# Patient Record
Sex: Female | Born: 1995 | Race: White | Hispanic: No | Marital: Single | Smoking: Never smoker
Health system: Southern US, Community
[De-identification: ages and names within clinical notes are randomized; demographics above are authoritative.]

## PROBLEM LIST (undated history)

## (undated) DIAGNOSIS — R002 Palpitations: Secondary | ICD-10-CM

## (undated) DIAGNOSIS — K219 Gastro-esophageal reflux disease without esophagitis: Secondary | ICD-10-CM

## (undated) DIAGNOSIS — I491 Atrial premature depolarization: Secondary | ICD-10-CM

## (undated) DIAGNOSIS — J45909 Unspecified asthma, uncomplicated: Secondary | ICD-10-CM

## (undated) HISTORY — DX: Palpitations: R00.2

## (undated) HISTORY — DX: Atrial premature depolarization: I49.1

## (undated) HISTORY — DX: Unspecified asthma, uncomplicated: J45.909

---
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
## 2016-02-15 ENCOUNTER — Telehealth: Payer: Self-pay | Admitting: *Deleted

## 2016-02-15 NOTE — Telephone Encounter (Signed)
Pt mom would like a return call regarding her bill. She states she has several concerns and would like to speak with you rather than someone else relay messages.

## 2016-02-16 NOTE — Telephone Encounter (Signed)
OK TO ADD TO [facility] A1 ACCT PER AMY

## 2016-02-16 NOTE — Telephone Encounter (Signed)
NOTE ABOUT DISCOUNT  BELOW IN ERROR - WRONG PT - SENT NOTE TO AMY J ABOUT ADDING Sharlize'S BAL TO [facility] A1 ACCT

## 2016-02-16 NOTE — Telephone Encounter (Signed)
OK TO GIVE DISCOUNT PER DR B °

## 2016-03-30 ENCOUNTER — Telehealth: Payer: Self-pay | Admitting: Allergy and Immunology

## 2016-03-30 NOTE — Telephone Encounter (Signed)
Please call pt mother back when you return regarding bill received. Thank you.

## 2016-04-04 NOTE — Telephone Encounter (Signed)
Asked Amy to send Amariz's bal over the [employer] or tell me who to call to have this done

## 2016-08-23 ENCOUNTER — Telehealth: Payer: Self-pay | Admitting: *Deleted

## 2016-08-23 NOTE — Telephone Encounter (Signed)
NOTES SENT TO SCHEDULING.  °

## 2016-08-30 ENCOUNTER — Telehealth: Payer: Self-pay | Admitting: Cardiovascular Disease

## 2016-08-30 NOTE — Telephone Encounter (Signed)
[facility] appointment on 09/12/16 with Dr Duke Salviaandolph.  Records put with Dr Leonides Sakeandolph's schedule for 09/12/16. lp

## 2016-09-12 ENCOUNTER — Ambulatory Visit (INDEPENDENT_AMBULATORY_CARE_PROVIDER_SITE_OTHER): Payer: BLUE CROSS/BLUE SHIELD | Admitting: Cardiovascular Disease

## 2016-09-12 ENCOUNTER — Encounter: Payer: Self-pay | Admitting: *Deleted

## 2016-09-12 ENCOUNTER — Encounter: Payer: Self-pay | Admitting: Cardiovascular Disease

## 2016-09-12 VITALS — BP 122/83 | HR 62 | Ht 63.0 in | Wt 172.8 lb

## 2016-09-12 DIAGNOSIS — R002 Palpitations: Secondary | ICD-10-CM

## 2016-09-12 HISTORY — DX: Palpitations: R00.2

## 2016-09-12 NOTE — Progress Notes (Signed)
Cardiology Office Note   Date:  09/12/2016   ID:  Paula MattocksKatie A Azure, DOB Jun 06, 1996, MRN [id number]  PCP:  Jarrett SohoWharton, Courtney, PA-C  Cardiologist:   Chilton Siiffany Houghton, MD   Chief Complaint  Patient presents with  . New Patient (Initial Visit)  . Shortness of Breath    randomly.  . Chest Pain    randomly.  . Dizziness    occasionally.      History of Present Illness: Paula MattocksKatie A Zavala is a 21 y.o. female with asthma who presents for an evaluation of palpitations.  She saw Dr. Catha GosselinKevin Little on 08/22/16 and reported palpitations.  For the last 2.5 months she reports Episodes of palpitations almost daily. It typically lasts for 15-20 minutes each day. It occurs without warning and is not associated with any particular activities. It occurs both at rest and with exertion. She feels like her heart is pounding and is going to come out of her chest. She has difficulty breathing and there is associated lightheadedness. Her chest feels tight. Prior to seeing Dr. Clarene DukeLittle she had an episode that lasted almost constantly for 2 days. She typically drinks 2 caffeinated sodas daily. She reduced this to one has noted swelling in her symptoms. She saw Dr. Clarene DukeLittle she had an EKG that was unremarkable.  She had blood work that was all reportedly normal.    Paula Zavala doesn't get any formal exercise but stands on her feet all day. 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 Asthma   . Heart palpitations   . Palpitations 09/12/2016    No past surgical history on file.   Current Outpatient Prescriptions  Medication Sig Dispense Refill  . acetaminophen (TYLENOL) 500 MG tablet Take 1,000 mg by mouth every 6 (six) hours as needed. Patient was given this medication for her stomach cramps.    . budesonide-formoterol (SYMBICORT) 160-4.5 MCG/ACT inhaler Inhale 2 puffs into the lungs 2 (two) times daily. 1 Inhaler 5  . EPINEPHrine  (EPIPEN 2-PAK) 0.3 mg/0.3 mL IJ SOAJ injection Inject 0.3 mLs (0.3 mg total) into the muscle once. 2 Device 2  . fluticasone (FLONASE) 50 MCG/ACT nasal spray 1 SPRAY IN EACH NARE 1-2 TIMES PER DAY 16 g 5  . fluticasone furoate-vilanterol (BREO ELLIPTA) 200-25 MCG/INH AEPB Inhale 1 puff into the lungs daily. 60 each 2  . ibuprofen (ADVIL,MOTRIN) 200 MG tablet Take 200 mg by mouth every 6 (six) hours as needed. Patient was given this medication for her stomach cramps.    . Norethin Ace-Eth Estrad-FE (TAYTULLA) 1-20 MG-MCG(24) CAPS Take 1-20 mg by mouth daily.    . pantoprazole (PROTONIX) 20 MG tablet TAKE ONE TABLET 30 MINUTESS BEFORE A MEAL 90 tablet 5  . PROAIR HFA 108 (90 Base) MCG/ACT inhaler Inhale 2 puffs into the lungs every 4 (four) hours as needed.  1  . ranitidine (ZANTAC) 300 MG tablet Take 1 tablet (300 mg total) by mouth at bedtime. 30 tablet 5   No current facility-administered medications for this visit.     Allergies:   Sulfa antibiotics    Social History:  The patient  reports that she has never smoked. She has never used smokeless tobacco. She reports that she drinks alcohol. She reports that she does not use drugs.   Family History:  The patient's family history includes Asthma in her sister; COPD in her paternal grandfather; Congenital heart  disease in her father; Heart attack in her maternal grandfather; Hypertension in her father, mother, and paternal grandfather; Hyperthyroidism in her maternal grandmother and mother.    ROS:  Please see the history of present illness.   Otherwise, review of systems are positive for none.   All other systems are reviewed and negative.    PHYSICAL EXAM: VS:  BP 122/83   Pulse 62   Ht 5\' 3"  (1.6 m)   Wt 78.4 kg (172 lb 12.8 oz)   BMI 30.61 kg/m  , BMI Body mass index is 30.61 kg/m. GENERAL:  Well appearing HEENT:  Pupils equal round and reactive, fundi not visualized, oral mucosa unremarkable NECK:  No jugular venous distention,  waveform within normal limits, carotid upstroke brisk and symmetric, no bruits, no thyromegaly LYMPHATICS:  No cervical adenopathy LUNGS:  Clear to auscultation bilaterally HEART:  RRR.  PMI not displaced or sustained,S1 and S2 within normal limits, no S3, no S4, no clicks, no rubs, no murmurs ABD:  Flat, positive bowel sounds normal in frequency in pitch, no bruits, no rebound, no guarding, no midline pulsatile mass, no hepatomegaly, no splenomegaly EXT:  2 plus pulses throughout, no edema, no cyanosis no clubbing SKIN:  No rashes no nodules NEURO:  Cranial nerves II through XII grossly intact, motor grossly intact throughout PSYCH:  Cognitively intact, oriented to person place and time    EKG:  EKG is not ordered today. 08/22/16: Sinus rhythm.  Rate 66 bpm.  PR 102 ms   Recent Labs: No results found for requested labs within last 8760 hours.   03/09/15: WBC 8.5, hemoglobin 12.1, hematocrit 36.6, platelets 240  11/07/14:  Sodium 136, potassium 4.2, BUN 11,  Creatinine 0.59 AST 14, ALT 14  Lipid Panel No results found for: CHOL, TRIG, HDL, CHOLHDL, VLDL, LDLCALC, LDLDIRECT    Wt Readings from Last 3 Encounters:  09/12/16 78.4 kg (172 lb 12.8 oz) (92 %, Z= 1.44)*  09/07/15 68 kg (149 lb 14.6 oz) (82 %, Z= 0.92)*  11/06/11 50.3 kg (111 lb) (42 %, Z= -0.21)*   * Growth percentiles are based on CDC 2-20 Years data.      ASSESSMENT AND PLAN:  # Palpitations:  Paula Zavala reports sustained palpitations.  Her EKG shows a short PR interval but no evidence of a delta wave.  She had labs checked in 2016 that were normal and recently had labs with her PCP, including her thyroid level, that were all reportedly normal. Given that his symptoms occur almost daily we will have her wear 48 hour old term to better assess.     Current medicines are reviewed at length with the patient today.  The patient does not have concerns regarding medicines.  The following changes have been made:  no  change  Labs/ tests ordered today include:   Orders Placed This Encounter  Procedures  . Holter monitor - 48 hour     Disposition:   FU with Sanayah Munro C. Duke Salvia, MD, [facility] in 1 month.    This note was written with the assistance of speech recognition software.  Please excuse any transcriptional errors.  Signed, Babe Clenney C. Duke Salvia, MD, [facility]  09/12/2016 4:44 PM    [facility]

## 2016-09-12 NOTE — Patient Instructions (Signed)
Medication Instructions:  Your physician recommends that you continue on your current medications as directed. Please refer to the Current Medication list given to you today.  Labwork: none  Testing/Procedures: Your physician has recommended that you wear a holter monitor. Holter monitors are medical devices that record the heart's electrical activity. Doctors most often use these monitors to diagnose arrhythmias. Arrhythmias are problems with the speed or rhythm of the heartbeat. The monitor is a small, portable device. You can wear one while you do your normal daily activities. This is usually used to diagnose what is causing palpitations/syncope (passing out). 49 hour [facility] AT [address]  Follow-Up: Your physician recommends that you schedule a follow-up appointment in: 1 MONTH OV  If you need a refill on your cardiac medications before your next appointment, please call your pharmacy.

## 2016-09-20 ENCOUNTER — Ambulatory Visit (INDEPENDENT_AMBULATORY_CARE_PROVIDER_SITE_OTHER): Payer: BLUE CROSS/BLUE SHIELD

## 2016-09-20 DIAGNOSIS — R002 Palpitations: Secondary | ICD-10-CM | POA: Diagnosis not present

## 2016-10-25 ENCOUNTER — Encounter: Payer: Self-pay | Admitting: Cardiovascular Disease

## 2016-10-25 ENCOUNTER — Ambulatory Visit (INDEPENDENT_AMBULATORY_CARE_PROVIDER_SITE_OTHER): Payer: BLUE CROSS/BLUE SHIELD | Admitting: Cardiovascular Disease

## 2016-10-25 VITALS — BP 113/76 | HR 62 | Ht 63.0 in | Wt 175.4 lb

## 2016-10-25 DIAGNOSIS — R002 Palpitations: Secondary | ICD-10-CM | POA: Diagnosis not present

## 2016-10-25 DIAGNOSIS — I491 Atrial premature depolarization: Secondary | ICD-10-CM | POA: Insufficient documentation

## 2016-10-25 HISTORY — DX: Atrial premature depolarization: I49.1

## 2016-10-25 MED ORDER — METOPROLOL TARTRATE 25 MG PO TABS: ORAL_TABLET | ORAL | 1 refills | Status: DC

## 2016-10-25 NOTE — Progress Notes (Signed)
Cardiology Office Note   Date:  10/25/2016   ID:  Paula Zavala, DOB 10/13/1995, MRN [id number]  PCP:  Jarrett Soho, PA-C  Cardiologist:   Chilton Si, MD   Chief Complaint  Patient presents with  . Follow-up    1 month;      History of Present Illness: Paula Zavala is a 21 y.o. female with asthma and PACs who presents for follow up.  She was first seen 08/2016 for an evaluation of palpitations.  She saw Dr. Catha Gosselin on 08/22/16 and reported palpitations.  At the time she was experiencing daily palpitations. She was referred for a 48 hour Holter that revealed occasional PACs and sinus arrhythmia. Since then she continues to have palpitations, though less frequently. It typically occurs at rest and there is no associated lightheadedness or dizziness. She has not had any changes in her diet and feels more stressed now than she was in the past. She denies chest pain, shortness of breath, lower extremity edema, orthopnea, or PND.  [name] doesn't get any formal exercise but stands on her feet all day. 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 Asthma   . Heart palpitations   . PAC (premature atrial contraction) 10/25/2016  . Palpitations 09/12/2016    No past surgical history on file.   Current Outpatient Prescriptions  Medication Sig Dispense Refill  . acetaminophen (TYLENOL) 500 MG tablet Take 1,000 mg by mouth every 6 (six) hours as needed. Patient was given this medication for her stomach cramps.    . budesonide-formoterol (SYMBICORT) 160-4.5 MCG/ACT inhaler Inhale 2 puffs into the lungs 2 (two) times daily. 1 Inhaler 5  . fluticasone furoate-vilanterol (BREO ELLIPTA) 200-25 MCG/INH AEPB Inhale 1 puff into the lungs daily. 60 each 2  . ibuprofen (ADVIL,MOTRIN) 200 MG tablet Take 200 mg by mouth every 6 (six) hours as needed. Patient was given this medication for her stomach  cramps.    . Norethin Ace-Eth Estrad-FE (TAYTULLA) 1-20 MG-MCG(24) CAPS Take 1-20 mg by mouth daily.    . pantoprazole (PROTONIX) 20 MG tablet TAKE ONE TABLET 30 MINUTESS BEFORE A MEAL 90 tablet 5  . PROAIR HFA 108 (90 Base) MCG/ACT inhaler Inhale 2 puffs into the lungs every 4 (four) hours as needed.  1  . ranitidine (ZANTAC) 300 MG tablet Take 1 tablet (300 mg total) by mouth at bedtime. 30 tablet 5  . metoprolol tartrate (LOPRESSOR) 25 MG tablet 1/2 TABLET BY MOUTH EVERY 4 HOURS AS NEEDED 30 tablet 1   No current facility-administered medications for this visit.     Allergies:   Sulfa antibiotics    Social History:  The patient  reports that she has never smoked. She has never used smokeless tobacco. She reports that she drinks alcohol. She reports that she does not use drugs.   Family History:  The patient's family history includes Asthma in her sister; COPD in her paternal grandfather; Congenital heart disease in her father; Heart attack in her maternal grandfather; Hypertension in her father, mother, and paternal grandfather; Hyperthyroidism in her maternal grandmother and mother.    ROS:  Please see the history of present illness.   Otherwise, review of systems are positive for none.   All other systems are reviewed and negative.    PHYSICAL EXAM: VS:  BP 113/76   Pulse 62   Ht 5\' 3"  (1.6  m)   Wt 79.6 kg (175 lb 6.4 oz)   BMI 31.07 kg/m  , BMI Body mass index is 31.07 kg/m. GENERAL:  Well appearing HEENT:  Pupils equal round and reactive, fundi not visualized, oral mucosa unremarkable NECK:  No jugular venous distention, waveform within normal limits, carotid upstroke brisk and symmetric, no bruits, no thyromegaly LYMPHATICS:  No cervical adenopathy LUNGS:  Clear to auscultation bilaterally HEART:  RRR.  PMI not displaced or sustained,S1 and S2 within normal limits, no S3, no S4, no clicks, no rubs, no murmurs ABD:  Flat, positive bowel sounds normal in frequency in pitch,  no bruits, no rebound, no guarding, no midline pulsatile mass, no hepatomegaly, no splenomegaly EXT:  2 plus pulses throughout, no edema, no cyanosis no clubbing SKIN:  No rashes no nodules NEURO:  Cranial nerves II through XII grossly intact, motor grossly intact throughout PSYCH:  Cognitively intact, oriented to person place and time    EKG:  EKG is not ordered today. 08/22/16: Sinus rhythm.  Rate 66 bpm.  PR 102 ms  48 Hour Holter Monitor 09/20/16:  Quality: Fair.  Baseline artifact. Predominant rhythm: sinus rhythm Average heart rate: 80 bpm Max heart rate: 146 bpm Min heart rate: 52 bpm PACs  No arrhythmias noted.  Recent Labs: No results found for requested labs within last 8760 hours.   03/09/15: WBC 8.5, hemoglobin 12.1, hematocrit 36.6, platelets 240  11/07/14:  Sodium 136, potassium 4.2, BUN 11,  Creatinine 0.59 AST 14, ALT 14  Lipid Panel No results found for: CHOL, TRIG, HDL, CHOLHDL, VLDL, LDLCALC, LDLDIRECT    Wt Readings from Last 3 Encounters:  10/25/16 79.6 kg (175 lb 6.4 oz)  09/12/16 78.4 kg (172 lb 12.8 oz) (92 %, Z= 1.44)*  09/07/15 68 kg (149 lb 14.6 oz) (82 %, Z= 0.92)*   * Growth percentiles are based on CDC 2-20 Years data.      ASSESSMENT AND PLAN:  # PACs:  [name] Continues to have palpitations, though less frequently than in the past.  Given her low blood pressure we will not start daily medication.  We will provide her with a prescription for metoprolol tartrate 12.5 mg to be taken as needed for sustained palpitations.   Current medicines are reviewed at length with the patient today.  The patient does not have concerns regarding medicines.  The following changes have been made:  Metoprolol 12.5 mg as needed  Labs/ tests ordered today include:   No orders of the defined types were placed in this encounter.    Disposition:   FU with Morna Flud C. Duke Salviaandolph, MD, [facility] in 6 months.    This note was written with the assistance of  speech recognition software.  Please excuse any transcriptional errors.  Signed, Ciearra Rufo C. Duke Salviaandolph, MD, [facility]  10/25/2016 4:38 PM     [facility]

## 2016-10-25 NOTE — Patient Instructions (Addendum)
Medication Instructions:  START METOPROLOL TART 12.5 MG ONE EVERY 4 HOURS AS NEEDED FOR PALPITATIONS   Labwork: NONE  Testing/Procedures: NONE  Follow-Up: Your physician wants you to follow-up in: 6 MONTH OV You will receive a reminder letter in the mail two months in advance. If you don't receive a letter, please call our office to schedule the follow-up appointment.  If you need a refill on your cardiac medications before your next appointment, please call your pharmacy.

## 2018-05-13 LAB — OB RESULTS CONSOLE HEPATITIS B SURFACE ANTIGEN: Hepatitis B Surface Ag: NEGATIVE

## 2018-05-13 LAB — OB RESULTS CONSOLE HIV ANTIBODY (ROUTINE TESTING): HIV: NONREACTIVE

## 2018-05-13 LAB — OB RESULTS CONSOLE RUBELLA ANTIBODY, IGM: Rubella: IMMUNE

## 2018-05-13 LAB — OB RESULTS CONSOLE RPR: RPR: NONREACTIVE

## 2018-05-16 LAB — OB RESULTS CONSOLE HIV ANTIBODY (ROUTINE TESTING): HIV: NONREACTIVE

## 2018-06-18 NOTE — L&D Delivery Note (Addendum)
Delivery Note At 12:22 PM a viable and healthy female was delivered via Vaginal, Spontaneous (Presentation: LOA ).  APGAR: 8, 9; weight  pending.   Placenta status: spontaneous, intact.  Cord:  withthe following complications: none .  Cord pH: na 300cc dark blood acutely after delivery c/w abruption  Anesthesia:  epidural Episiotomy: None Lacerations: 2nd degree Suture Repair: 2.0 vicryl rapide Est. Blood Loss (mL): 200  Mom to postpartum.  Baby to Couplet care / Skin to Skin.  Rpt labs now. Occ severe range BPs with pushing. Will consider MgSO4 pending labs and BP PP.  Verdia Bolt J 11/25/2018, 12:40 PM

## 2018-07-07 ENCOUNTER — Other Ambulatory Visit: Payer: Self-pay | Admitting: Obstetrics and Gynecology

## 2018-11-23 ENCOUNTER — Inpatient Hospital Stay (HOSPITAL_COMMUNITY): Payer: BLUE CROSS/BLUE SHIELD

## 2018-11-24 ENCOUNTER — Encounter (HOSPITAL_COMMUNITY): Payer: Self-pay

## 2018-11-24 ENCOUNTER — Other Ambulatory Visit: Payer: Self-pay | Admitting: Obstetrics and Gynecology

## 2018-11-24 ENCOUNTER — Other Ambulatory Visit: Payer: Self-pay

## 2018-11-24 ENCOUNTER — Inpatient Hospital Stay (HOSPITAL_COMMUNITY)
Admission: AD | Admit: 2018-11-24 | Discharge: 2018-11-27 | DRG: 807 | Disposition: A | Discharge status: Home / Self Care | Payer: BLUE CROSS/BLUE SHIELD | Attending: Obstetrics and Gynecology | Admitting: Obstetrics and Gynecology

## 2018-11-24 DIAGNOSIS — Z3A37 37 weeks gestation of pregnancy: Secondary | ICD-10-CM

## 2018-11-24 DIAGNOSIS — O134 Gestational [pregnancy-induced] hypertension without significant proteinuria, complicating childbirth: Principal | ICD-10-CM | POA: Diagnosis present

## 2018-11-24 DIAGNOSIS — O99214 Obesity complicating childbirth: Secondary | ICD-10-CM | POA: Diagnosis present

## 2018-11-24 DIAGNOSIS — Z1159 Encounter for screening for other viral diseases: Secondary | ICD-10-CM | POA: Diagnosis not present

## 2018-11-24 DIAGNOSIS — Z3490 Encounter for supervision of normal pregnancy, unspecified, unspecified trimester: Secondary | ICD-10-CM

## 2018-11-24 DIAGNOSIS — K219 Gastro-esophageal reflux disease without esophagitis: Secondary | ICD-10-CM

## 2018-11-24 DIAGNOSIS — O139 Gestational [pregnancy-induced] hypertension without significant proteinuria, unspecified trimester: Secondary | ICD-10-CM | POA: Diagnosis present

## 2018-11-24 LAB — CBC
HCT: 40.2 % (ref 36.0–46.0)
Hemoglobin: 13.5 g/dL (ref 12.0–15.0)
MCH: 28.5 pg (ref 26.0–34.0)
MCHC: 33.6 g/dL (ref 30.0–36.0)
MCV: 85 fL (ref 80.0–100.0)
Platelets: 165 10*3/uL (ref 150–400)
RBC: 4.73 MIL/uL (ref 3.87–5.11)
RDW: 14.2 % (ref 11.5–15.5)
WBC: 10.7 10*3/uL — ABNORMAL HIGH (ref 4.0–10.5)
nRBC: 0.4 % — ABNORMAL HIGH (ref 0.0–0.2)

## 2018-11-24 LAB — COMPREHENSIVE METABOLIC PANEL
ALT: 14 U/L (ref 0–44)
AST: 22 U/L (ref 15–41)
Albumin: 2.8 g/dL — ABNORMAL LOW (ref 3.5–5.0)
Alkaline Phosphatase: 189 U/L — ABNORMAL HIGH (ref 38–126)
Anion gap: 12 (ref 5–15)
BUN: 8 mg/dL (ref 6–20)
CO2: 21 mmol/L — ABNORMAL LOW (ref 22–32)
Calcium: 9.5 mg/dL (ref 8.9–10.3)
Chloride: 105 mmol/L (ref 98–111)
Creatinine, Ser: 0.62 mg/dL (ref 0.44–1.00)
GFR calc Af Amer: >60 mL/min (ref >60)
GFR calc non Af Amer: >60 mL/min (ref >60)
Glucose, Bld: 97 mg/dL (ref 70–99)
Potassium: 3.8 mmol/L (ref 3.5–5.1)
Sodium: 138 mmol/L (ref 135–145)
Total Bilirubin: 0.3 mg/dL (ref 0.3–1.2)
Total Protein: 6.4 g/dL — ABNORMAL LOW (ref 6.5–8.1)

## 2018-11-24 LAB — TYPE AND SCREEN
ABO/RH(D): O POS
Antibody Screen: NEGATIVE

## 2018-11-24 MED ORDER — OXYCODONE-ACETAMINOPHEN 5-325 MG PO TABS: 2.0000 | ORAL_TABLET | ORAL | Status: DC | PRN

## 2018-11-24 MED ORDER — OXYCODONE-ACETAMINOPHEN 5-325 MG PO TABS: 1.0000 | ORAL_TABLET | ORAL | Status: DC | PRN

## 2018-11-24 MED ORDER — LIDOCAINE HCL (PF) 1 % IJ SOLN: 30.0000 mL | INTRAMUSCULAR | Status: DC | PRN

## 2018-11-24 MED ORDER — OXYTOCIN 40 UNITS IN NORMAL SALINE INFUSION - SIMPLE MED: 2.5000 [IU]/h | INTRAVENOUS | Status: DC

## 2018-11-24 MED ORDER — LACTATED RINGERS IV SOLN: 500.0000 mL | INTRAVENOUS | Status: DC | PRN

## 2018-11-24 MED ORDER — OXYTOCIN BOLUS FROM INFUSION: 500.0000 mL | Freq: Once | INTRAVENOUS | Status: DC

## 2018-11-24 MED ORDER — TERBUTALINE SULFATE 1 MG/ML IJ SOLN: 0.2500 mg | Freq: Once | INTRAMUSCULAR | Status: DC | PRN

## 2018-11-24 MED ORDER — OXYTOCIN 40 UNITS IN NORMAL SALINE INFUSION - SIMPLE MED
1.0000 m[IU]/min | INTRAVENOUS | Status: DC
  Administered 2018-11-25: 2 m[IU]/min via INTRAVENOUS
  Filled 2018-11-24: qty 1000

## 2018-11-24 MED ORDER — MISOPROSTOL 25 MCG QUARTER TABLET
25.0000 ug | ORAL_TABLET | ORAL | Status: DC | PRN
  Administered 2018-11-24 – 2018-11-25 (×2): 25 ug via VAGINAL
  Filled 2018-11-24 (×2): qty 1

## 2018-11-24 MED ORDER — ACETAMINOPHEN 325 MG PO TABS: 650.0000 mg | ORAL_TABLET | ORAL | Status: DC | PRN

## 2018-11-24 MED ORDER — ONDANSETRON HCL 4 MG/2ML IJ SOLN: 4.0000 mg | Freq: Four times a day (QID) | INTRAMUSCULAR | Status: DC | PRN

## 2018-11-24 MED ORDER — LACTATED RINGERS IV SOLN
INTRAVENOUS | Status: DC
  Administered 2018-11-24 – 2018-11-25 (×2): via INTRAVENOUS

## 2018-11-24 MED ORDER — SOD CITRATE-CITRIC ACID 500-334 MG/5ML PO SOLN: 30.0000 mL | ORAL | Status: DC | PRN

## 2018-11-24 MED ORDER — FENTANYL CITRATE (PF) 100 MCG/2ML IJ SOLN
100.0000 ug | INTRAMUSCULAR | Status: DC | PRN
  Filled 2018-11-24 (×3): qty 2

## 2018-11-24 NOTE — H&P (Signed)
Paula Zavala is a 23 y.o. female presenting for IOL for gestational htn. OB History   No obstetric history on file.    Past Medical History:  Diagnosis Date  . Asthma   . Heart palpitations   . PAC (premature atrial contraction) 10/25/2016  . Palpitations 09/12/2016   No past surgical history on file. Family History: family history includes Asthma in her sister; COPD in her paternal grandfather; Congenital heart disease in her father; Heart attack in her maternal grandfather; Hypertension in her father, mother, and paternal grandfather; Hyperthyroidism in her maternal grandmother and mother. Social History:  reports that she has never smoked. She has never used smokeless tobacco. She reports current alcohol use. She reports that she does not use drugs.     Maternal Diabetes: No Genetic Screening: Normal Maternal Ultrasounds/Referrals: Normal Fetal Ultrasounds or other Referrals:  None Maternal Substance Abuse:  No Significant Maternal Medications:  None Significant Maternal Lab Results:  None Other Comments:  None  Review of Systems  Constitutional: Negative.   All other systems reviewed and are negative.  Maternal Medical History:  Reason for admission: Contractions.   Contractions: Onset was less than 1 hour ago.   Frequency: rare.   Perceived severity is mild.    Fetal activity: Perceived fetal activity is normal.   Last perceived fetal movement was within the past hour.    Prenatal complications: PIH.   Prenatal Complications - Diabetes: none.      There were no vitals taken for this visit. Maternal Exam:  Uterine Assessment: Contraction strength is mild.  Contraction frequency is rare.   Abdomen: Patient reports no abdominal tenderness. Fetal presentation: vertex  Introitus: Normal vulva. Normal vagina.  Ferning test: not done.  Nitrazine test: not done. Amniotic fluid character: not assessed.  Pelvis: adequate for delivery.   Cervix: Cervix evaluated by  digital exam.     Physical Exam  Nursing note and vitals reviewed. Constitutional: She is oriented to person, place, and time. She appears well-developed and well-nourished.  HENT:  Head: Normocephalic and atraumatic.  Neck: Normal range of motion. Neck supple.  Cardiovascular: Normal rate and regular rhythm.  Respiratory: Effort normal and breath sounds normal.  GI: Soft. Bowel sounds are normal.  Genitourinary:    Vulva, vagina and uterus normal.   Musculoskeletal: Normal range of motion.  Neurological: She is alert and oriented to person, place, and time. She has normal reflexes.  Skin: Skin is warm and dry.  Psychiatric: She has a normal mood and affect.    Prenatal labs: ABO, Rh:   Antibody:   Rubella:   RPR:    HBsAg:    HIV:    GBS:     Assessment/Plan: 37wk IUP Gestational HTN IOL   Karinna Beadles J 11/24/2018, 10:12 PM

## 2018-11-25 ENCOUNTER — Inpatient Hospital Stay (HOSPITAL_COMMUNITY): Payer: BLUE CROSS/BLUE SHIELD | Admitting: Anesthesiology

## 2018-11-25 ENCOUNTER — Encounter (HOSPITAL_COMMUNITY): Payer: Self-pay | Admitting: *Deleted

## 2018-11-25 DIAGNOSIS — O139 Gestational [pregnancy-induced] hypertension without significant proteinuria, unspecified trimester: Secondary | ICD-10-CM | POA: Diagnosis present

## 2018-11-25 LAB — CBC WITH DIFFERENTIAL/PLATELET
Abs Immature Granulocytes: 0.08 10*3/uL — ABNORMAL HIGH (ref 0.00–0.07)
Basophils Absolute: 0 10*3/uL (ref 0.0–0.1)
Basophils Relative: 0 %
Eosinophils Absolute: 0 10*3/uL (ref 0.0–0.5)
Eosinophils Relative: 0 %
HCT: 35.8 % — ABNORMAL LOW (ref 36.0–46.0)
Hemoglobin: 11.9 g/dL — ABNORMAL LOW (ref 12.0–15.0)
Immature Granulocytes: 1 %
Lymphocytes Relative: 6 %
Lymphs Abs: 1 10*3/uL (ref 0.7–4.0)
MCH: 28.6 pg (ref 26.0–34.0)
MCHC: 33.2 g/dL (ref 30.0–36.0)
MCV: 86.1 fL (ref 80.0–100.0)
Monocytes Absolute: 1 10*3/uL (ref 0.1–1.0)
Monocytes Relative: 6 %
Neutro Abs: 14 10*3/uL — ABNORMAL HIGH (ref 1.7–7.7)
Neutrophils Relative %: 87 %
Platelets: 155 10*3/uL (ref 150–400)
RBC: 4.16 MIL/uL (ref 3.87–5.11)
RDW: 14.3 % (ref 11.5–15.5)
WBC: 16.1 10*3/uL — ABNORMAL HIGH (ref 4.0–10.5)
nRBC: 0.1 % (ref 0.0–0.2)

## 2018-11-25 LAB — COMPREHENSIVE METABOLIC PANEL
ALT: 12 U/L (ref 0–44)
AST: 19 U/L (ref 15–41)
Albumin: 2.4 g/dL — ABNORMAL LOW (ref 3.5–5.0)
Alkaline Phosphatase: 156 U/L — ABNORMAL HIGH (ref 38–126)
Anion gap: 11 (ref 5–15)
BUN: 6 mg/dL (ref 6–20)
CO2: 19 mmol/L — ABNORMAL LOW (ref 22–32)
Calcium: 8.3 mg/dL — ABNORMAL LOW (ref 8.9–10.3)
Chloride: 106 mmol/L (ref 98–111)
Creatinine, Ser: 0.59 mg/dL (ref 0.44–1.00)
GFR calc Af Amer: >60 mL/min (ref >60)
GFR calc non Af Amer: >60 mL/min (ref >60)
Glucose, Bld: 83 mg/dL (ref 70–99)
Potassium: 3.7 mmol/L (ref 3.5–5.1)
Sodium: 136 mmol/L (ref 135–145)
Total Bilirubin: 0.4 mg/dL (ref 0.3–1.2)
Total Protein: 5.5 g/dL — ABNORMAL LOW (ref 6.5–8.1)

## 2018-11-25 LAB — RPR: RPR Ser Ql: NONREACTIVE

## 2018-11-25 LAB — SARS CORONAVIRUS 2: SARS Coronavirus 2: NOT DETECTED

## 2018-11-25 MED ORDER — DIBUCAINE (PERIANAL) 1 % EX OINT: 1.0000 "application " | TOPICAL_OINTMENT | CUTANEOUS | Status: DC | PRN

## 2018-11-25 MED ORDER — BUPIVACAINE HCL (PF) 0.25 % IJ SOLN
INTRAMUSCULAR | Status: DC | PRN
  Administered 2018-11-25 (×2): 5 mL via EPIDURAL

## 2018-11-25 MED ORDER — WITCH HAZEL-GLYCERIN EX PADS: 1.0000 "application " | MEDICATED_PAD | CUTANEOUS | Status: DC | PRN

## 2018-11-25 MED ORDER — BENZOCAINE-MENTHOL 20-0.5 % EX AERO
1.0000 "application " | INHALATION_SPRAY | CUTANEOUS | Status: DC | PRN
  Administered 2018-11-25: 1 via TOPICAL
  Filled 2018-11-25: qty 56

## 2018-11-25 MED ORDER — LABETALOL HCL 5 MG/ML IV SOLN: 20.0000 mg | INTRAVENOUS | Status: DC | PRN

## 2018-11-25 MED ORDER — DIPHENHYDRAMINE HCL 25 MG PO CAPS: 25.0000 mg | ORAL_CAPSULE | Freq: Four times a day (QID) | ORAL | Status: DC | PRN

## 2018-11-25 MED ORDER — TETANUS-DIPHTH-ACELL PERTUSSIS 5-2.5-18.5 LF-MCG/0.5 IM SUSP: 0.5000 mL | Freq: Once | INTRAMUSCULAR | Status: DC

## 2018-11-25 MED ORDER — COCONUT OIL OIL: 1.0000 "application " | TOPICAL_OIL | Status: DC | PRN

## 2018-11-25 MED ORDER — SENNOSIDES-DOCUSATE SODIUM 8.6-50 MG PO TABS
2.0000 | ORAL_TABLET | ORAL | Status: DC
  Administered 2018-11-25 – 2018-11-26 (×2): 2 via ORAL
  Filled 2018-11-25 (×2): qty 2

## 2018-11-25 MED ORDER — LABETALOL HCL 5 MG/ML IV SOLN: 80.0000 mg | INTRAVENOUS | Status: DC | PRN

## 2018-11-25 MED ORDER — ONDANSETRON HCL 4 MG PO TABS: 4.0000 mg | ORAL_TABLET | ORAL | Status: DC | PRN

## 2018-11-25 MED ORDER — SIMETHICONE 80 MG PO CHEW: 80.0000 mg | CHEWABLE_TABLET | ORAL | Status: DC | PRN

## 2018-11-25 MED ORDER — DIPHENHYDRAMINE HCL 50 MG/ML IJ SOLN: 12.5000 mg | INTRAMUSCULAR | Status: DC | PRN

## 2018-11-25 MED ORDER — HYDRALAZINE HCL 20 MG/ML IJ SOLN: 10.0000 mg | INTRAMUSCULAR | Status: DC | PRN

## 2018-11-25 MED ORDER — ZOLPIDEM TARTRATE 5 MG PO TABS: 5.0000 mg | ORAL_TABLET | Freq: Every evening | ORAL | Status: DC | PRN

## 2018-11-25 MED ORDER — EPHEDRINE 5 MG/ML INJ: 10.0000 mg | INTRAVENOUS | Status: DC | PRN

## 2018-11-25 MED ORDER — PRENATAL MULTIVITAMIN CH
1.0000 | ORAL_TABLET | Freq: Every day | ORAL | Status: DC
  Administered 2018-11-26: 1 via ORAL
  Filled 2018-11-25: qty 1

## 2018-11-25 MED ORDER — ONDANSETRON HCL 4 MG/2ML IJ SOLN: 4.0000 mg | INTRAMUSCULAR | Status: DC | PRN

## 2018-11-25 MED ORDER — OXYCODONE-ACETAMINOPHEN 5-325 MG PO TABS: 1.0000 | ORAL_TABLET | ORAL | Status: DC | PRN

## 2018-11-25 MED ORDER — OXYCODONE-ACETAMINOPHEN 5-325 MG PO TABS: 2.0000 | ORAL_TABLET | ORAL | Status: DC | PRN

## 2018-11-25 MED ORDER — PHENYLEPHRINE 40 MCG/ML (10ML) SYRINGE FOR IV PUSH (FOR BLOOD PRESSURE SUPPORT): 80.0000 ug | PREFILLED_SYRINGE | INTRAVENOUS | Status: DC | PRN

## 2018-11-25 MED ORDER — SODIUM CHLORIDE (PF) 0.9 % IJ SOLN
INTRAMUSCULAR | Status: DC | PRN
  Administered 2018-11-25: 12 mL/h via EPIDURAL

## 2018-11-25 MED ORDER — LIDOCAINE HCL (PF) 1 % IJ SOLN
INTRAMUSCULAR | Status: DC | PRN
  Administered 2018-11-25 (×4): 5 mL via EPIDURAL

## 2018-11-25 MED ORDER — FENTANYL-BUPIVACAINE-NACL 0.5-0.125-0.9 MG/250ML-% EP SOLN
12.0000 mL/h | EPIDURAL | Status: DC | PRN
  Filled 2018-11-25: qty 250

## 2018-11-25 MED ORDER — ACETAMINOPHEN 325 MG PO TABS
650.0000 mg | ORAL_TABLET | ORAL | Status: DC | PRN
  Administered 2018-11-25: 650 mg via ORAL
  Filled 2018-11-25: qty 2

## 2018-11-25 MED ORDER — IBUPROFEN 600 MG PO TABS
600.0000 mg | ORAL_TABLET | Freq: Four times a day (QID) | ORAL | Status: DC
  Administered 2018-11-25 – 2018-11-26 (×6): 600 mg via ORAL
  Filled 2018-11-25 (×7): qty 1

## 2018-11-25 MED ORDER — LACTATED RINGERS IV SOLN: 500.0000 mL | Freq: Once | INTRAVENOUS | Status: DC

## 2018-11-25 MED ORDER — LABETALOL HCL 5 MG/ML IV SOLN: 40.0000 mg | INTRAVENOUS | Status: DC | PRN

## 2018-11-25 NOTE — Anesthesia Preprocedure Evaluation (Signed)
Anesthesia Evaluation  Patient identified by MRN, date of birth, ID band Patient awake    Reviewed: Allergy & Precautions, NPO status , Patient's Chart, lab work & pertinent test results  Airway Mallampati: II       Dental no notable dental hx. (+) Dental Advisory Given   Pulmonary asthma ,    Pulmonary exam normal        Cardiovascular negative cardio ROS Normal cardiovascular exam     Neuro/Psych negative neurological ROS  negative psych ROS   GI/Hepatic Neg liver ROS, GERD  ,  Endo/Other  Morbid obesity  Renal/GU negative Renal ROS  negative genitourinary   Musculoskeletal negative musculoskeletal ROS (+)   Abdominal   Peds negative pediatric ROS (+)  Hematology negative hematology ROS (+)   Anesthesia Other Findings   Reproductive/Obstetrics (+) Pregnancy                             Anesthesia Physical Anesthesia Plan  ASA: III  Anesthesia Plan: Epidural   Post-op Pain Management:    Induction:   PONV Risk Score and Plan:   Airway Management Planned: Natural Airway  Additional Equipment:   Intra-op Plan:   Post-operative Plan:   Informed Consent: I have reviewed the patients History and Physical, chart, labs and discussed the procedure including the risks, benefits and alternatives for the proposed anesthesia with the patient or authorized representative who has indicated his/her understanding and acceptance.     Dental advisory given  Plan Discussed with: Anesthesiologist  Anesthesia Plan Comments:         Anesthesia Quick Evaluation

## 2018-11-25 NOTE — Progress Notes (Signed)
NICKCOLE BRALLEY is a 23 y.o. G1P0 at [redacted]w[redacted]d by LMP admitted for induction of labor due to Hypertension.  Subjective: crampy  Objective: BP (!) 142/79   Pulse (!) 50   Temp 98.9 F (37.2 C) (Oral)   Resp 16   Ht 5\' 2"  (1.575 m)   Wt 91.6 kg   BMI 36.95 kg/m  No intake/output data recorded. No intake/output data recorded.  FHT:  FHR: 145 bpm, variability: moderate,  accelerations:  Present,  decelerations:  Absent UC:   regular, every 1-4 minutes SVE:   Dilation: Fingertip Effacement (%): Thick Station: -2 Exam by:: [place] McHEnry RN  Labs: Lab Results  Component Value Date   WBC 10.7 (H) 11/24/2018   HGB 13.5 11/24/2018   HCT 40.2 11/24/2018   MCV 85.0 11/24/2018   PLT 165 11/24/2018    Assessment / Plan: IOL for Gestatonal HTN  Labor: Progressing normally Preeclampsia:  no signs or symptoms of toxicity Fetal Wellbeing:  Category I Pain Control:  Labor support without medications I/D:  n/a Anticipated MOD:  NSVD  Genelle Economou J 11/25/2018, 6:29 AM

## 2018-11-25 NOTE — Anesthesia Procedure Notes (Signed)
Epidural Patient location during procedure: OB Start time: 11/25/2018 8:22 AM End time: 11/25/2018 8:41 AM  Staffing Anesthesiologist: Duane Boston, MD Performed: anesthesiologist   Preanesthetic Checklist Completed: patient identified, site marked, pre-op evaluation, timeout performed, IV checked, risks and benefits discussed and monitors and equipment checked  Epidural Patient position: sitting Prep: DuraPrep Patient monitoring: heart rate, cardiac monitor, continuous pulse ox and blood pressure Approach: midline Location: L2-L3 Injection technique: LOR saline  Needle:  Needle type: Tuohy  Needle gauge: 17 G Needle length: 9 cm Needle insertion depth: 8 cm Catheter size: 20 Guage Catheter at skin depth: 12 cm Test dose: negative and Other  Assessment Events: blood not aspirated, injection not painful, no injection resistance and negative IV test  Additional Notes Informed consent obtained prior to proceeding including risk of failure, 1% risk of PDPH, risk of minor discomfort and bruising.  Discussed rare but serious complications including epidural abscess, permanent nerve injury, epidural hematoma.  Discussed alternatives to epidural analgesia and patient desires to proceed.  Timeout performed pre-procedure verifying patient name, procedure, and platelet count.  Patient tolerated procedure well.

## 2018-11-25 NOTE — Progress Notes (Signed)
I was asked by Dr. Ronita Hipps to assess patient and place IUPC.  IOL at 37+1 weeks for gestational hypertension.  S:  Feeling more comfortable after 2nd epidural placement. Still feeling some pressure on left side during contractions. No headaches, visual changes, or RUQ/epigastric pain.   O:  Pitocin at 12 milliunits  VS: Blood pressure (!) 159/105, pulse 88, temperature 98.9 F (37.2 C), temperature source Oral, resp. rate 16, height 5\' 2"  (1.575 m), weight 91.6 kg.         /09/20 1104  -  77  -  16  160/100Abnormal   -  -  -  - SJ   11/25/18 1101  -  70  -  16  156/105Abnormal   -  -  -  - SJ   11/25/18 0752  -  88  -  16  159/105Abnormal   -  -  -  - SJ   11/25/18 0745  -  84  -  16  147/103Abnormal   -  -  -  - SJ   11/25/18 0605  -  50Abnormal   -  -  142/79Abnormal   -  -  -  - GM   11/25/18 0520  -  53Abnormal   -  16  125/70  -  -  -  - GM   11/25/18 0306  -  60  -  16  140/92Abnormal   -  -  -  - GM   11/25/18 0305  98.9 F (37.2 C)  -  -  16  -  -  -  -  - GM     FHR : baseline 135 bpm / variability moderate / accelerations +15x15 / occasional variable decelerations        Toco: contractions every 1.5-3 minutes / moderate        Cervix : Dilation: 4.5 Effacement (%): 90 Cervical Position: Posterior Station: -1 Presentation: Vertex Exam by:: Taelyn Broecker        Membranes: clear IUPC placed without difficulty and pt. Tolerated well   A/P: IOL for gestational hypertension   - A few severe range BPs noted, but possibly due to pain response, monitor closely and treat as needed  - IV Labetalol PRN for severe range BPs   - No neural s/s     Active labor     FHR category 2     GBS Negative      Continue Pitocin augmentation to achieve adequate MVUs  Dr. Ronita Hipps updated with progress/plan     Lars Pinks, MSN, CNM [facility]

## 2018-11-25 NOTE — Progress Notes (Signed)
Called Dr. Ronita Hipps to report patient's blood pressure. Dr. Ronita Hipps stated he was aware of blood pressure, stated possibly pain related and gave orders for Labetelol prn administration per severe blood pressure range.

## 2018-11-25 NOTE — Lactation Note (Signed)
This note was copied from a baby's chart. Lactation Consultation Note  Patient Name: Paula Zavala [id number] Date: 11/25/2018 Reason for consult: Initial assessment;Primapara;1st time breastfeeding;Infant < 6lbs;Early term 37-38.6wks;Other (Comment)(SGA)  6 hours old ETI < 6 lbs female who is now being partially BF and formula fed by his mother, she's a P1. Mom experienced (+) breast changes during the pregnancy, she's already familiar with hand expression. When LC revised hand expression with mom noticed that her nipples were almost flat, but they're compressible. [place] set mom up with a DEBP and breast shells, instructions, cleaning and storage were reviewed as well as milk storage guidelines.  Baby doing STS with mom when entering the room, offered assistance with latch and mom agreed to latch baby on towards the end of [facility] consultation. Baby still jittery despite excellent blood sugars, parents have already give baby 7 ml of Similac 22 calorie formula, baby was very spitty during [facility] consultation and not ready to feed, even when mom attempted to latch towards the end of [facility] consultation baby was just not ready and fell asleep at the breast. Asked mom to call for assistance when needed.   Reviewed normal newborn behavior, feeding cues, cluster feeding, benefits of STS, risk of offering babies pacifiers (they had one in the room) and LPI policy with supplementation guidelines according to baby's age in hours.   Feeding plan:  1. Encourage mom to feed baby STS 8-12 times/24 hours or sooner if feeding cues are present 2. Mom will pump every 3 hours and will offer any amounts of EBM she may get to baby before offering any formula 3. Parents will continue supplementing with Similac 22 calorie formula every 3 hours or sooner if feeding cues are present 4. She'll start wearing her breast shells tomorrow, daytime only  BF brochure, BF resources, LPI handout and feeding diary were reviewed. Parents reported all  questions and concerns were answered, they're both aware of [facility] OP services and will call PRN.  Maternal Data Formula Feeding for Exclusion: No Has patient been taught Hand Expression?: Yes Does the patient have breastfeeding experience prior to this delivery?: No  Feeding Feeding Type: Breast Fed Nipple Type: Slow - flow  LATCH Score Latch: Too sleepy or reluctant, no latch achieved, no sucking elicited.  Audible Swallowing: None  Type of Nipple: Flat  Comfort (Breast/Nipple): Soft / non-tender  Hold (Positioning): Full assist, staff holds infant at breast  LATCH Score: 3  Interventions Interventions: Breast feeding basics reviewed;Assisted with latch;Skin to skin;Breast massage;Hand express;Breast compression;Reverse pressure;Adjust position;Support pillows;Shells;DEBP  Lactation Tools Discussed/Used Tools: Shells;Pump Shell Type: Inverted Breast pump type: Double-Electric Breast Pump WIC Program: No Pump Review: Setup, frequency, and cleaning;Milk Storage Initiated by:: MPeck Date initiated:: 11/25/18   Consult Status Consult Status: Follow-up Date: 11/26/18 Follow-up type: In-patient    Victorious Cosio Francene Boyers 11/25/2018, 6:34 PM

## 2018-11-25 NOTE — Progress Notes (Signed)
Called to room to assist Dr. Tobias Alexander. Dr. Tobias Alexander made the decision to replace the epidural and assisted with patient's positioning. Epidural replaced at 1037.

## 2018-11-25 NOTE — Anesthesia Procedure Notes (Signed)
Epidural Patient location during procedure: OB Start time: 11/25/2018 10:32 AM End time: 11/25/2018 10:44 AM  Staffing Anesthesiologist: Duane Boston, MD Performed: anesthesiologist   Preanesthetic Checklist Completed: patient identified, site marked, pre-op evaluation, timeout performed, IV checked, risks and benefits discussed and monitors and equipment checked  Epidural Patient position: sitting Prep: DuraPrep Patient monitoring: heart rate, cardiac monitor, continuous pulse ox and blood pressure Approach: midline Location: L3-L4 Injection technique: LOR saline  Needle:  Needle type: Tuohy  Needle gauge: 17 G Needle length: 9 cm Needle insertion depth: 7 cm Catheter size: 20 Guage Catheter at skin depth: 11 cm Test dose: negative and Other  Assessment Events: blood not aspirated, injection not painful, no injection resistance and negative IV test  Additional Notes Informed consent obtained prior to proceeding including risk of failure, 1% risk of PDPH, risk of minor discomfort and bruising.  Discussed rare but serious complications including epidural abscess, permanent nerve injury, epidural hematoma.  Discussed alternatives to epidural analgesia and patient desires to proceed.  Timeout performed pre-procedure verifying patient name, procedure, and platelet count.  Patient tolerated procedure well.

## 2018-11-25 NOTE — Progress Notes (Signed)
Paula Zavala is a 23 y.o. G1P0 at [redacted]w[redacted]d by LMP admitted for induction of labor due to Hypertension.  Subjective: No headaches or epigastric pain  Objective: BP (!) 159/105   Pulse 88   Temp 98.9 F (37.2 C) (Oral)   Resp 16   Ht 5\' 2"  (1.575 m)   Wt 91.6 kg   BMI 36.95 kg/m  No intake/output data recorded. No intake/output data recorded.  FHT:  FHR: 145 bpm, variability: moderate,  accelerations:  Present,  decelerations:  Absent UC:   irregular, every 1-4 minutes SVE:   Dilation: 2 Effacement (%): 70 Station: -2 Exam by:: Dr. Ronita Hipps  AROM- clear  Labs: Lab Results  Component Value Date   WBC 10.7 (H) 11/24/2018   HGB 13.5 11/24/2018   HCT 40.2 11/24/2018   MCV 85.0 11/24/2018   PLT 165 11/24/2018    Assessment / Plan: Induction of labor due to gestational hypertension,  progressing well on pitocin  Labor: Progressing normally Preeclampsia:  no signs or symptoms of toxicity Fetal Wellbeing:  Category I Pain Control:  Labor support without medications I/D:  n/a Anticipated MOD:  NSVD  Ophia Shamoon J 11/25/2018, 8:08 AM

## 2018-11-25 NOTE — Anesthesia Postprocedure Evaluation (Signed)
Anesthesia Post Note  Patient: Paula Zavala  Procedure(s) Performed: AN AD HOC LABOR EPIDURAL     Patient location during evaluation: Mother Baby Anesthesia Type: Epidural Level of consciousness: awake and alert Pain management: pain level controlled Vital Signs Assessment: post-procedure vital signs reviewed and stable Respiratory status: spontaneous breathing, nonlabored ventilation and respiratory function stable Cardiovascular status: stable Postop Assessment: no headache, no backache and epidural receding Anesthetic complications: no    Last Vitals:  Vitals:   11/25/18 1400 11/25/18 1445  BP: 122/72 134/90  Pulse: 84 66  Resp: 16 18  Temp:  37.3 C  SpO2:  98%    Last Pain:  Vitals:   11/25/18 1445  TempSrc: Oral  PainSc: 0-No pain   Pain Goal:                Epidural/Spinal Function Cutaneous sensation: Normal sensation (11/25/18 1445), Patient able to flex knees: Yes (11/25/18 1445), Patient able to lift hips off bed: Yes (11/25/18 1445), Back pain beyond tenderness at insertion site: No (11/25/18 1445), Progressively worsening motor and/or sensory loss: No (11/25/18 1445), Bowel and/or bladder incontinence post epidural: No (11/25/18 1445)  [name]

## 2018-11-25 NOTE — Progress Notes (Signed)
Dr. Tobias Alexander at bedside evaluating patient's pain relief response to epidural.

## 2018-11-26 LAB — CBC
HCT: 33.1 % — ABNORMAL LOW (ref 36.0–46.0)
Hemoglobin: 10.9 g/dL — ABNORMAL LOW (ref 12.0–15.0)
MCH: 28.5 pg (ref 26.0–34.0)
MCHC: 32.9 g/dL (ref 30.0–36.0)
MCV: 86.4 fL (ref 80.0–100.0)
Platelets: 135 10*3/uL — ABNORMAL LOW (ref 150–400)
RBC: 3.83 MIL/uL — ABNORMAL LOW (ref 3.87–5.11)
RDW: 14.5 % (ref 11.5–15.5)
WBC: 11.6 10*3/uL — ABNORMAL HIGH (ref 4.0–10.5)
nRBC: 0 % (ref 0.0–0.2)

## 2018-11-26 LAB — ABO/RH: ABO/RH(D): O POS

## 2018-11-26 MED ORDER — LABETALOL HCL 100 MG PO TABS
100.0000 mg | ORAL_TABLET | Freq: Two times a day (BID) | ORAL | Status: DC
  Administered 2018-11-26 – 2018-11-27 (×3): 100 mg via ORAL
  Filled 2018-11-26 (×3): qty 1

## 2018-11-26 NOTE — Progress Notes (Signed)
Post Partum Day 1 Subjective: no complaints, voiding, tolerating PO and + flatus  Objective: Blood pressure (!) 125/91, pulse 66, temperature 98.1 F (36.7 C), temperature source Oral, resp. rate 14, height 5\' 2"  (1.575 m), weight 91.6 kg, SpO2 99 %, unknown if currently breastfeeding.  Physical Exam:  General: alert, cooperative and appears stated age Lochia: appropriate Uterine Fundus: firm Incision: healing well DVT Evaluation: Negative Homan's sign.  Recent Labs    11/25/18 1256 11/26/18 0733  HGB 11.9* 10.9*  HCT 35.8* 33.1*    Assessment/Plan: PPD 1 Gestational HTN Start Labetalol Plan for discharge tomorrow   LOS: 2 days   Bellatrix Devonshire J 11/26/2018, 11:02 AM

## 2018-11-26 NOTE — Lactation Note (Signed)
This note was copied from a baby's chart. Lactation Consultation Note  Patient Name: Paula Zavala [id number] Date: 11/26/2018 Reason for consult: Follow-up assessment;Early term 37-38.6wks;Primapara;Infant < 6lbs;1st time breastfeeding  P1 mother whose infant is now 57 hours old.  This is an ETI at 37+1 weeks weighing < 6 lbs.  Baby has been jittery and hypoglycemic.    Mother was holding baby STS when I arrived.  Baby had recently had his first good feed of 25 mls at 1200.  Prior to this feeding the volumes were minimal.  Mother has not been focusing on latching at this time due to the hypoglycemia.  Another blood sugar will be collected at 1300.  Parents feel like the RN was able to get him to feed well this last time.  Father admitted that he does not feed well with the parents yet.  Encouraged parents to allow baby to feed whatever volume he is willing to take.  If they have any difficulty getting him to awaken and feed I asked them to call their RN for assistance.  Parents verbalized understanding.  Discussed with parents the importance of holding baby STS as much as possible and to be very diligent about feeding.  Mother will continue to pump and increase pumping intervals to every three hours, feeding back any EBM she obtains to baby.  She had no questions regarding the pump.  Offered to return after the 1300 lab draw for further questions or concerns if needed.  Parents appreciative of assistance.  Lab tech entering room as I left.  RN aware.   Maternal Data Formula Feeding for Exclusion: No Has patient been taught Hand Expression?: Yes Does the patient have breastfeeding experience prior to this delivery?: No  Feeding Feeding Type: Formula Nipple Type: Slow - flow  LATCH Score                   Interventions    Lactation Tools Discussed/Used     Consult Status Consult Status: Follow-up Date: 11/27/18 Follow-up type: In-patient    Marylynn Rigdon R Amadea Keagy 11/26/2018,  1:05 PM

## 2018-11-27 MED ORDER — IBUPROFEN 600 MG PO TABS: 600.0000 mg | ORAL_TABLET | Freq: Four times a day (QID) | ORAL | 0 refills | Status: AC

## 2018-11-27 MED ORDER — LABETALOL HCL 100 MG PO TABS: 100.0000 mg | ORAL_TABLET | Freq: Two times a day (BID) | ORAL | 1 refills | Status: AC

## 2018-11-27 MED ORDER — SENNOSIDES-DOCUSATE SODIUM 8.6-50 MG PO TABS: 2.0000 | ORAL_TABLET | Freq: Every day | ORAL | 1 refills | Status: AC

## 2018-11-27 MED ORDER — ACETAMINOPHEN 325 MG PO TABS: 650.0000 mg | ORAL_TABLET | ORAL | 1 refills | Status: AC | PRN

## 2018-11-27 NOTE — Discharge Summary (Signed)
OB Discharge Summary  Patient Name: Paula Zavala DOB: [birth year] MRN: [id number]  Date of admission: 11/24/2018 Delivering MD: Brien Few   Date of discharge: 11/27/2018  Admitting diagnosis: pregnancy Intrauterine pregnancy: [redacted]w[redacted]d     Secondary diagnosis:Principal Problem:   Postpartum care following vaginal delivery (6/9) Active Problems:   Pregnant   SVD (spontaneous vaginal delivery)   Gestational hypertension   Second degree perineal laceration  Additional problems:none     Discharge diagnosis: Term Pregnancy Delivered and Gestational Hypertension                                                                     Post partum procedures:none  Augmentation: see MAR  Complications: None  Hospital course:  Induction of Labor With Vaginal Delivery   23 y.o. yo G1P1001 at [redacted]w[redacted]d was admitted to the hospital 11/24/2018 for induction of labor.  Indication for induction: Gestational hypertension.  Patient had an uncomplicated labor course as follows: Membrane Rupture Time/Date: 7:56 AM ,11/25/2018   Intrapartum Procedures: Episiotomy: None [1]                                         Lacerations:  2nd degree [3]  Patient had delivery of a Viable infant.  Information for the patient's newborn:  Jandi, Swiger [[id number]]  Delivery Method: Vaginal, Spontaneous(Filed from Delivery Summary)    11/25/2018  Details of delivery can be found in separate delivery note.  Patient had a routine postpartum course. Patient is discharged home 11/27/18.  Physical exam  Vitals:   11/26/18 0556 11/26/18 1355 11/26/18 2204 11/27/18 0610  BP: (!) 125/91 117/74 124/86 (!) 141/86  Pulse: 66 96  65  Resp: 14 15  18   Temp: 98.1 F (36.7 C) 97.9 F (36.6 C) 97.7 F (36.5 C) 98 F (36.7 C)  TempSrc: Oral Oral Oral Oral  SpO2: 99%  99% 100%  Weight:      Height:       General: alert, cooperative and no distress Lochia: appropriate Uterine Fundus: firm Incision: N/A DVT Evaluation:  No evidence of DVT seen on physical exam. Labs: Lab Results  Component Value Date   WBC 11.6 (H) 11/26/2018   HGB 10.9 (L) 11/26/2018   HCT 33.1 (L) 11/26/2018   MCV 86.4 11/26/2018   PLT 135 (L) 11/26/2018   CMP Latest Ref Rng & Units 11/25/2018  Glucose 70 - 99 mg/dL 83  BUN 6 - 20 mg/dL 6  Creatinine 0.44 - 1.00 mg/dL 0.59  Sodium 135 - 145 mmol/L 136  Potassium 3.5 - 5.1 mmol/L 3.7  Chloride 98 - 111 mmol/L 106  CO2 22 - 32 mmol/L 19(L)  Calcium 8.9 - 10.3 mg/dL 8.3(L)  Total Protein 6.5 - 8.1 g/dL 5.5(L)  Total Bilirubin 0.3 - 1.2 mg/dL 0.4  Alkaline Phos 38 - 126 U/L 156(H)  AST 15 - 41 U/L 19  ALT 0 - 44 U/L 12    Discharge instruction: per After Visit Summary and "Baby and Me Booklet".  After Visit Meds:  Allergies as of 11/27/2018      Reactions   Sulfa Antibiotics Hives  Medication List    STOP taking these medications   calcium carbonate 500 MG chewable tablet Commonly known as: TUMS - dosed in mg elemental calcium   pantoprazole 20 MG tablet Commonly known as: Protonix   prenatal multivitamin Tabs tablet     TAKE these medications   acetaminophen 325 MG tablet Commonly known as: Tylenol Take 2 tablets (650 mg total) by mouth every 4 (four) hours as needed (for pain scale < 4).   ibuprofen 600 MG tablet Commonly known as: ADVIL Take 1 tablet (600 mg total) by mouth every 6 (six) hours.   labetalol 100 MG tablet Commonly known as: NORMODYNE Take 1 tablet (100 mg total) by mouth 2 (two) times daily.   ProAir HFA 108 (90 Base) MCG/ACT inhaler Generic drug: albuterol Inhale 2 puffs into the lungs every 4 (four) hours as needed.   senna-docusate 8.6-50 MG tablet Commonly known as: Senokot-S Take 2 tablets by mouth at bedtime.       Diet: routine diet  Activity: Advance as tolerated. Pelvic rest for 6 weeks.   Outpatient follow up:1 wk for bp check Follow up Appt:No future appointments. Follow up visit: No follow-ups on  file.  Postpartum contraception: Not Discussed  Newborn Data: Live born female  Birth Weight: 5 lb 7 oz (2466 g) APGAR: 8, 9  Newborn Delivery   Birth date/time: 11/25/2018 12:22:00 Delivery type: Vaginal, Spontaneous      Baby Feeding: Breast Disposition:home with mother   11/27/2018 Lendon ColonelKelly A Tequan Redmon, MD

## 2018-11-27 NOTE — Lactation Note (Signed)
This note was copied from a baby's chart. Lactation Consultation Note  Patient Name: Paula Zavala Date: 11/27/2018 Reason for consult: Follow-up assessment;Infant < 6lbs;Infant weight loss  Baby is 66 hours old  Per mom the baby was circ'd this am and only would take 3 ml at 0900.  LC reviewed potential feeding patterns with an Early term infant.  LC stressed feeding with feeding and by 3 hours around the clock STS  Feedings. If the baby is sluggish to start to try and appetizer of EBM or formula  To get the baby sucking and then latch. If still sluggish finish the feeding with the artifical  Nipple and post pump both breast.  LC stressed STS feedings until the baby is back to birth weight, gaining steadily and  Can stay awake for majority of feeding.  Per mom waiting for her DEBP from insurance company. LC recommended to mom is by  Tomorrow her DEBP is not arrived  from the insurance tp consider coming back to the hospital  To rent a  DEBP from the gift shop. Mom already has the  DEBP kit that she has pumped x 5 in the  Last 24 hours and its been a slow process.  Mom denies soreness, sore nipple and engorgement prevention and tx reviewed.  Per mom has shells , LC recommended to wear them between feedings except when sleeping with a bra.  LC offered to request and LC O/P appt for next week or for her to call for the appt. Mom choice to call back.   Mom aware of the [facility] resources after D/C .       Maternal Data    Feeding Feeding Type: Breast Milk Nipple Type: Slow - flow  LATCH Score                   Interventions Interventions: Breast feeding basics reviewed  Lactation Tools Discussed/Used Tools: Shells;Pump Shell Type: Inverted Breast pump type: Double-Electric Breast Pump;Manual Pump Review: Milk Storage   Consult Status Consult Status: Follow-up Follow-up type: Paula Zavala 11/27/2018, 11:03 AM

## 2022-11-21 ENCOUNTER — Encounter (HOSPITAL_BASED_OUTPATIENT_CLINIC_OR_DEPARTMENT_OTHER): Payer: Self-pay | Admitting: *Deleted

## 2022-11-21 ENCOUNTER — Emergency Department (HOSPITAL_BASED_OUTPATIENT_CLINIC_OR_DEPARTMENT_OTHER)
Admission: EM | Admit: 2022-11-21 | Discharge: 2022-11-21 | Disposition: A | Discharge status: Home / Self Care | Payer: 59 | Attending: Emergency Medicine | Admitting: Emergency Medicine

## 2022-11-21 ENCOUNTER — Emergency Department (HOSPITAL_BASED_OUTPATIENT_CLINIC_OR_DEPARTMENT_OTHER): Payer: 59

## 2022-11-21 ENCOUNTER — Other Ambulatory Visit: Payer: Self-pay

## 2022-11-21 DIAGNOSIS — J45909 Unspecified asthma, uncomplicated: Secondary | ICD-10-CM | POA: Diagnosis not present

## 2022-11-21 DIAGNOSIS — N200 Calculus of kidney: Secondary | ICD-10-CM | POA: Insufficient documentation

## 2022-11-21 DIAGNOSIS — R109 Unspecified abdominal pain: Secondary | ICD-10-CM | POA: Diagnosis present

## 2022-11-21 HISTORY — DX: Gastro-esophageal reflux disease without esophagitis: K21.9

## 2022-11-21 LAB — COMPREHENSIVE METABOLIC PANEL
ALT: 33 U/L (ref 0–44)
AST: 18 U/L (ref 15–41)
Albumin: 4.8 g/dL (ref 3.5–5.0)
Alkaline Phosphatase: 91 U/L (ref 38–126)
Anion gap: 15 (ref 5–15)
BUN: 12 mg/dL (ref 6–20)
CO2: 24 mmol/L (ref 22–32)
Calcium: 9.6 mg/dL (ref 8.9–10.3)
Chloride: 100 mmol/L (ref 98–111)
Creatinine, Ser: 0.84 mg/dL (ref 0.44–1.00)
GFR, Estimated: >60 mL/min (ref >60)
Glucose, Bld: 122 mg/dL — ABNORMAL HIGH (ref 70–99)
Potassium: 3.9 mmol/L (ref 3.5–5.1)
Sodium: 139 mmol/L (ref 135–145)
Total Bilirubin: 0.4 mg/dL (ref 0.3–1.2)
Total Protein: 8.3 g/dL — ABNORMAL HIGH (ref 6.5–8.1)

## 2022-11-21 LAB — CBC
HCT: 42.1 % (ref 36.0–46.0)
Hemoglobin: 13.8 g/dL (ref 12.0–15.0)
MCH: 27 pg (ref 26.0–34.0)
MCHC: 32.8 g/dL (ref 30.0–36.0)
MCV: 82.2 fL (ref 80.0–100.0)
Platelets: 381 10*3/uL (ref 150–400)
RBC: 5.12 MIL/uL — ABNORMAL HIGH (ref 3.87–5.11)
RDW: 14.6 % (ref 11.5–15.5)
WBC: 15.5 10*3/uL — ABNORMAL HIGH (ref 4.0–10.5)
nRBC: 0 % (ref 0.0–0.2)

## 2022-11-21 LAB — PREGNANCY, URINE: Preg Test, Ur: NEGATIVE

## 2022-11-21 LAB — URINALYSIS, ROUTINE W REFLEX MICROSCOPIC
Bilirubin Urine: NEGATIVE
Glucose, UA: NEGATIVE mg/dL
Ketones, ur: NEGATIVE mg/dL
Leukocytes,Ua: NEGATIVE
Nitrite: NEGATIVE
Protein, ur: 30 mg/dL — AB
RBC / HPF: >50 RBC/hpf (ref 0–5)
Specific Gravity, Urine: 1.028 (ref 1.005–1.030)
pH: 5.5 (ref 5.0–8.0)

## 2022-11-21 MED ORDER — NAPROXEN 500 MG PO TABS: 500.0000 mg | ORAL_TABLET | Freq: Two times a day (BID) | ORAL | 0 refills | Status: AC

## 2022-11-21 MED ORDER — ONDANSETRON HCL 4 MG/2ML IJ SOLN
4.0000 mg | Freq: Once | INTRAMUSCULAR | Status: AC
  Administered 2022-11-21: 4 mg via INTRAVENOUS
  Filled 2022-11-21: qty 2

## 2022-11-21 MED ORDER — SODIUM CHLORIDE 0.9 % IV BOLUS
1000.0000 mL | Freq: Once | INTRAVENOUS | Status: AC
  Administered 2022-11-21: 1000 mL via INTRAVENOUS

## 2022-11-21 MED ORDER — HYDROMORPHONE HCL 1 MG/ML IJ SOLN
1.0000 mg | Freq: Once | INTRAMUSCULAR | Status: AC
  Administered 2022-11-21: 1 mg via INTRAVENOUS
  Filled 2022-11-21: qty 1

## 2022-11-21 MED ORDER — KETOROLAC TROMETHAMINE 15 MG/ML IJ SOLN
15.0000 mg | Freq: Once | INTRAMUSCULAR | Status: AC
  Administered 2022-11-21: 15 mg via INTRAVENOUS
  Filled 2022-11-21: qty 1

## 2022-11-21 MED ORDER — OXYCODONE HCL 5 MG PO TABS: 5.0000 mg | ORAL_TABLET | ORAL | 0 refills | Status: AC | PRN

## 2022-11-21 NOTE — Discharge Instructions (Signed)
You were evaluated in the Emergency Department and after careful evaluation, we did not find any emergent condition requiring admission or further testing in the hospital.  Your exam/testing today was overall reassuring.  Symptoms seem to be due to a kidney stone.  Take the Naprosyn anti-inflammatory twice daily for pain.  Use the oxycodone for more significant pain.  Follow-up with the urologist.  Please return to the Emergency Department if you experience any worsening of your condition.  Thank you for allowing [place] to be a part of your care.

## 2022-11-21 NOTE — ED Triage Notes (Signed)
Pt reporting left lower abdominal pain that radiates to the back, started about 0130, has been progressively worse. Feels like pressure. Vomited x 1. Urinary urgency. Denies fevers.

## 2022-11-21 NOTE — ED Notes (Signed)
Pt to CT by wheelchair

## 2022-11-21 NOTE — ED Provider Notes (Signed)
[facility] Emergency Department Provider Note MRN:  [id number]  Arrival date & time: 11/21/22     Chief Complaint   Abdominal Pain   History of Present Illness   Paula Zavala is a 27 y.o. year-old female with no pertinent past medical history presenting to the ED with chief complaint of abdominal pain.  Left flank pain with radiation down to the left lower quadrant acutely over the past 3 hours.  Associated with nausea vomiting.  Review of Systems  A thorough review of systems was obtained and all systems are negative except as noted in the HPI and PMH.   Patient's Health History    Past Medical History:  Diagnosis Date   Asthma    GERD (gastroesophageal reflux disease)    Heart palpitations    PAC (premature atrial contraction) 10/25/2016   Palpitations 09/12/2016    History reviewed. No pertinent surgical history.  Family History  Problem Relation Age of Onset   Asthma Sister    Hypertension Mother    Hyperthyroidism Mother    Hypertension Father    Congenital heart disease Father    Hyperthyroidism Maternal Grandmother    Hypertension Paternal Grandfather    COPD Paternal Grandfather    Heart attack Maternal Grandfather     Social History   Socioeconomic History   Marital status: Single    Spouse name: Not on file   Number of children: Not on file   Years of education: Not on file   Highest education level: Not on file  Occupational History   Not on file  Tobacco Use   Smoking status: Never   Smokeless tobacco: Never  Substance and Sexual Activity   Alcohol use: Yes    Alcohol/week: 0.0 standard drinks of alcohol   Drug use: No   Sexual activity: Never  Other Topics Concern   Not on file  Social History Narrative   Not on file   Social Determinants of Health   Financial Resource Strain: Not on file  Food Insecurity: Not on file  Transportation Needs: Not on file  Physical Activity: Not on file  Stress: Not on file   Social Connections: Not on file  Intimate Partner Violence: Not on file     Physical Exam   Vitals:   11/21/22 0445 11/21/22 0500  BP: 105/71 107/76  Pulse: 86 91  Resp: 16   Temp:    SpO2: 97% 98%    CONSTITUTIONAL: Well-appearing, actively vomiting, in obvious pain NEURO/PSYCH:  Alert and oriented x 3, no focal deficits EYES:  eyes equal and reactive ENT/NECK:  no LAD, no JVD CARDIO: Regular rate, well-perfused, normal S1 and S2 PULM:  CTAB no wheezing or rhonchi GI/GU:  non-distended, non-tender MSK/SPINE:  No gross deformities, no edema SKIN:  no rash, atraumatic   *Additional and/or pertinent findings included in MDM below  Diagnostic and Interventional Summary    EKG Interpretation  Date/Time:    Ventricular Rate:    PR Interval:    QRS Duration:   QT Interval:    QTC Calculation:   R Axis:     Text Interpretation:         Labs Reviewed  URINALYSIS, ROUTINE W REFLEX MICROSCOPIC - Abnormal; Notable for the following components:      Result Value   APPearance HAZY (*)    Hgb urine dipstick LARGE (*)    Protein, ur 30 (*)    Bacteria, UA MANY (*)    All other  components within normal limits  CBC - Abnormal; Notable for the following components:   WBC 15.5 (*)    RBC 5.12 (*)    All other components within normal limits  COMPREHENSIVE METABOLIC PANEL - Abnormal; Notable for the following components:   Glucose, Bld 122 (*)    Total Protein 8.3 (*)    All other components within normal limits  URINE CULTURE  PREGNANCY, URINE    CT Renal Stone Study  Final Result      Medications  HYDROmorphone (DILAUDID) injection 1 mg (1 mg Intravenous Given 11/21/22 0415)  ondansetron (ZOFRAN) injection 4 mg (4 mg Intravenous Given 11/21/22 0415)  sodium chloride 0.9 % bolus 1,000 mL (1,000 mLs Intravenous New Bag/Given 11/21/22 0433)  ketorolac (TORADOL) 15 MG/ML injection 15 mg (15 mg Intravenous Given 11/21/22 0415)     Procedures  /  Critical  Care Procedures  ED Course and Medical Decision Making  Initial Impression and Ddx Suspect kidney stone, other considerations include ovarian cyst/torsion, diverticulitis, splenic infarct.  Past medical/surgical history that increases complexity of ED encounter: None  Interpretation of Diagnostics I personally reviewed the laboratory assessment and my interpretation is as follows: No significant blood count or electrolyte disturbance  Urinalysis seems contaminated, doubt infected, will send for culture.  CT confirms stone.  Patient Reassessment and Ultimate Disposition/Management     Patient feeling better, appropriate for discharge.  Patient management required discussion with the following services or consulting groups:  None  Complexity of Problems Addressed Acute illness or injury that poses threat of life of bodily function  Additional Data Reviewed and Analyzed Further history obtained from: Further history from spouse/family member  Additional Factors Impacting ED Encounter Risk Use of parenteral controlled substances  Elmer Sow. Pilar Plate, MD [facility] Emergency Medicine [facility] [email or web address]  Final Clinical Impressions(s) / ED Diagnoses     ICD-10-CM   1. Kidney stone  N20.0       ED Discharge Orders          Ordered    naproxen (NAPROSYN) 500 MG tablet  2 times daily        11/21/22 0533    oxyCODONE (ROXICODONE) 5 MG immediate release tablet  Every 4 hours PRN        11/21/22 0533             Discharge Instructions Discussed with and Provided to Patient:     Discharge Instructions      You were evaluated in the Emergency Department and after careful evaluation, we did not find any emergent condition requiring admission or further testing in the hospital.  Your exam/testing today was overall reassuring.  Symptoms seem to be due to a kidney stone.  Take the Naprosyn anti-inflammatory twice daily for pain.  Use the  oxycodone for more significant pain.  Follow-up with the urologist.  Please return to the Emergency Department if you experience any worsening of your condition.  Thank you for allowing [place] to be a part of your care.        Sabas Sous, MD 11/21/22 [phone]

## 2022-11-22 LAB — URINE CULTURE: Culture: NO GROWTH

## 2023-08-02 ENCOUNTER — Other Ambulatory Visit: Payer: Self-pay
